# Patient Record
Sex: Male | Born: 1985 | Marital: Married
Health system: Southern US, Community
[De-identification: ages and names within clinical notes are randomized; demographics above are authoritative.]

---

## 2014-01-14 ENCOUNTER — Ambulatory Visit
Admission: RE | Admit: 2014-01-14 | Discharge: 2014-01-14 | Disposition: A | Discharge status: Home / Self Care | Payer: No Typology Code available for payment source | Source: Ambulatory Visit | Attending: *Deleted | Admitting: *Deleted

## 2014-01-14 ENCOUNTER — Other Ambulatory Visit: Payer: Self-pay | Admitting: *Deleted

## 2014-01-14 DIAGNOSIS — Z9289 Personal history of other medical treatment: Secondary | ICD-10-CM

## 2015-07-20 IMAGING — CR DG CHEST 2V
2 series · 2 of 2 positions shown · non-contrast
Comparison: None.

CLINICAL DATA: Positive PPD, smoker

EXAM:
CHEST  2 VIEW

[w chest pa]
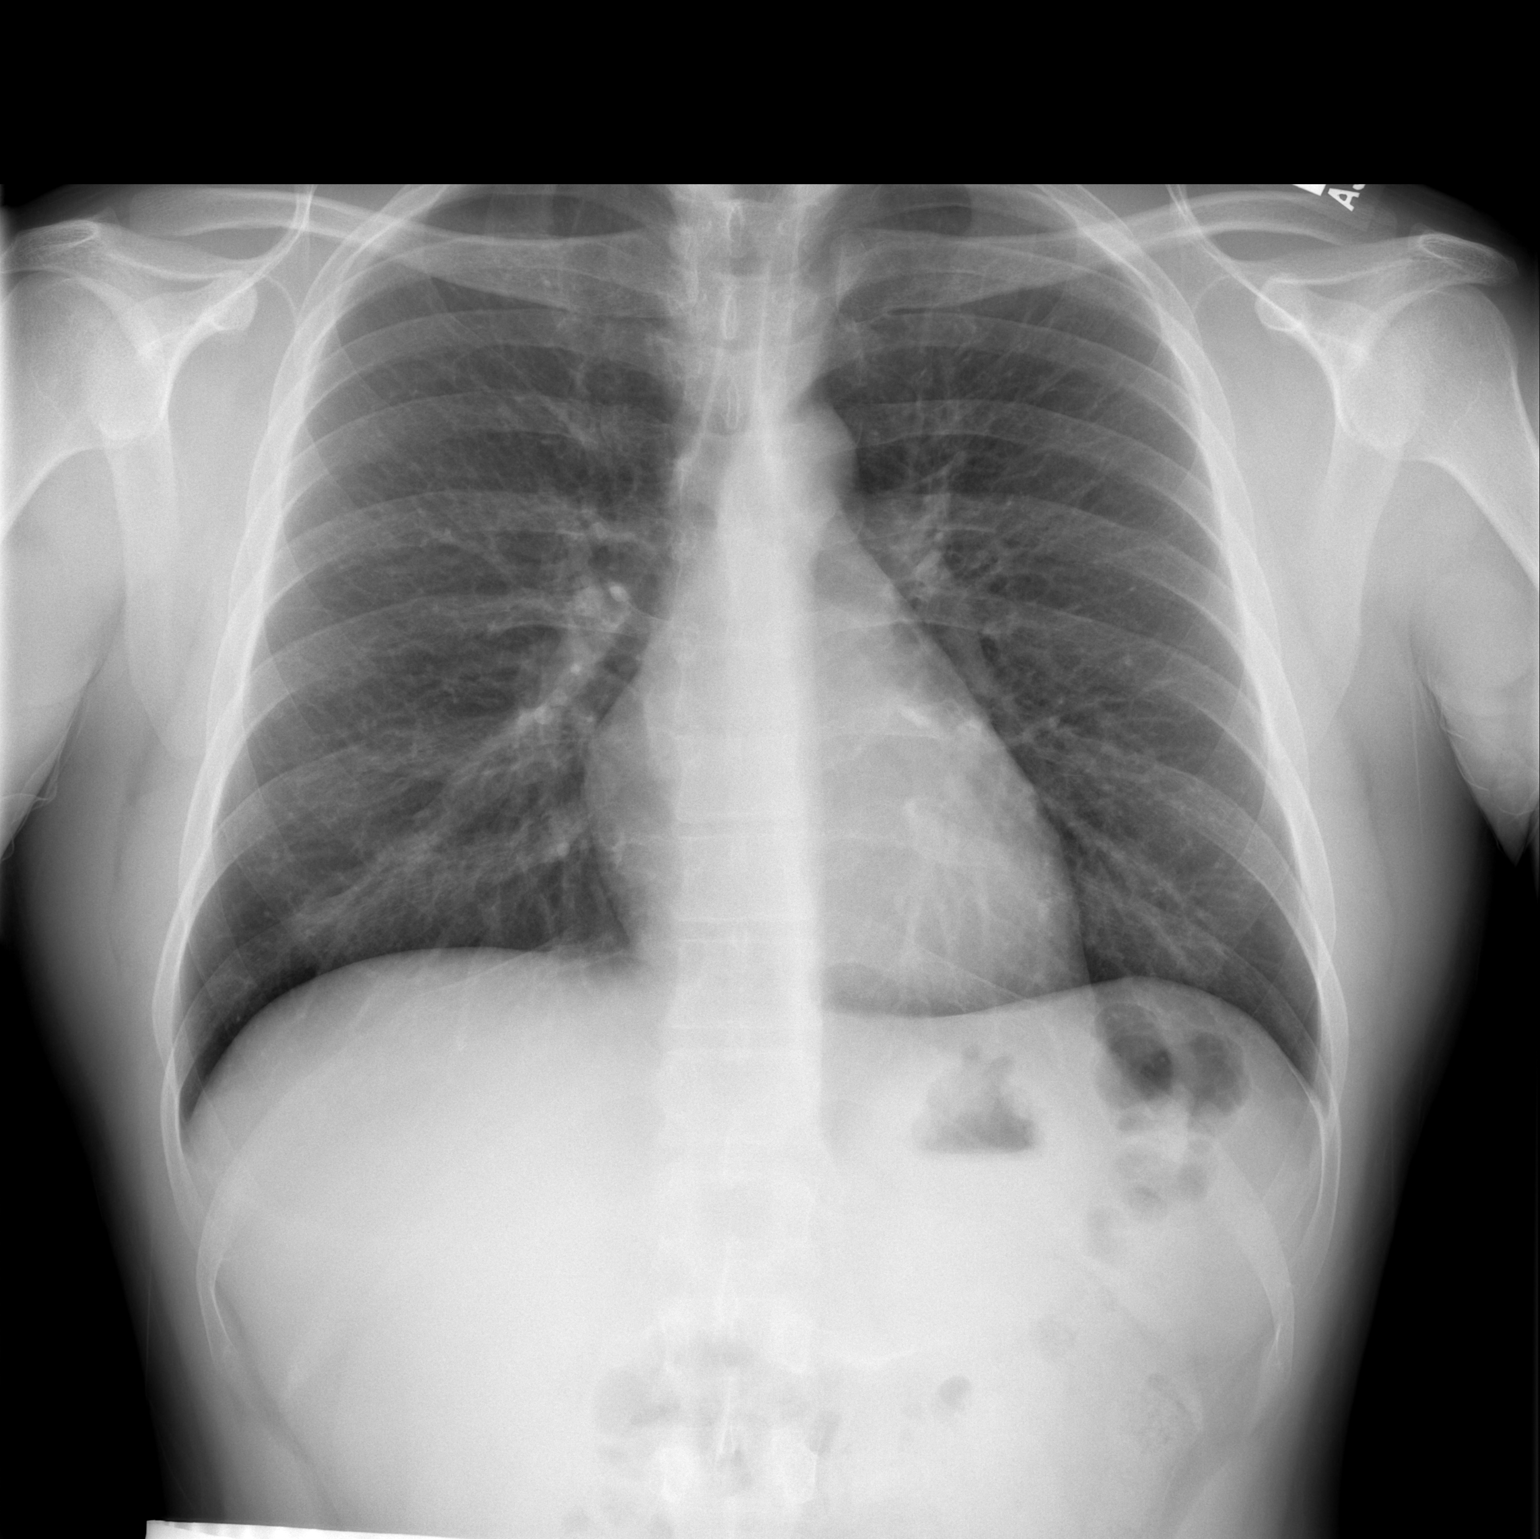

[w chest lat]
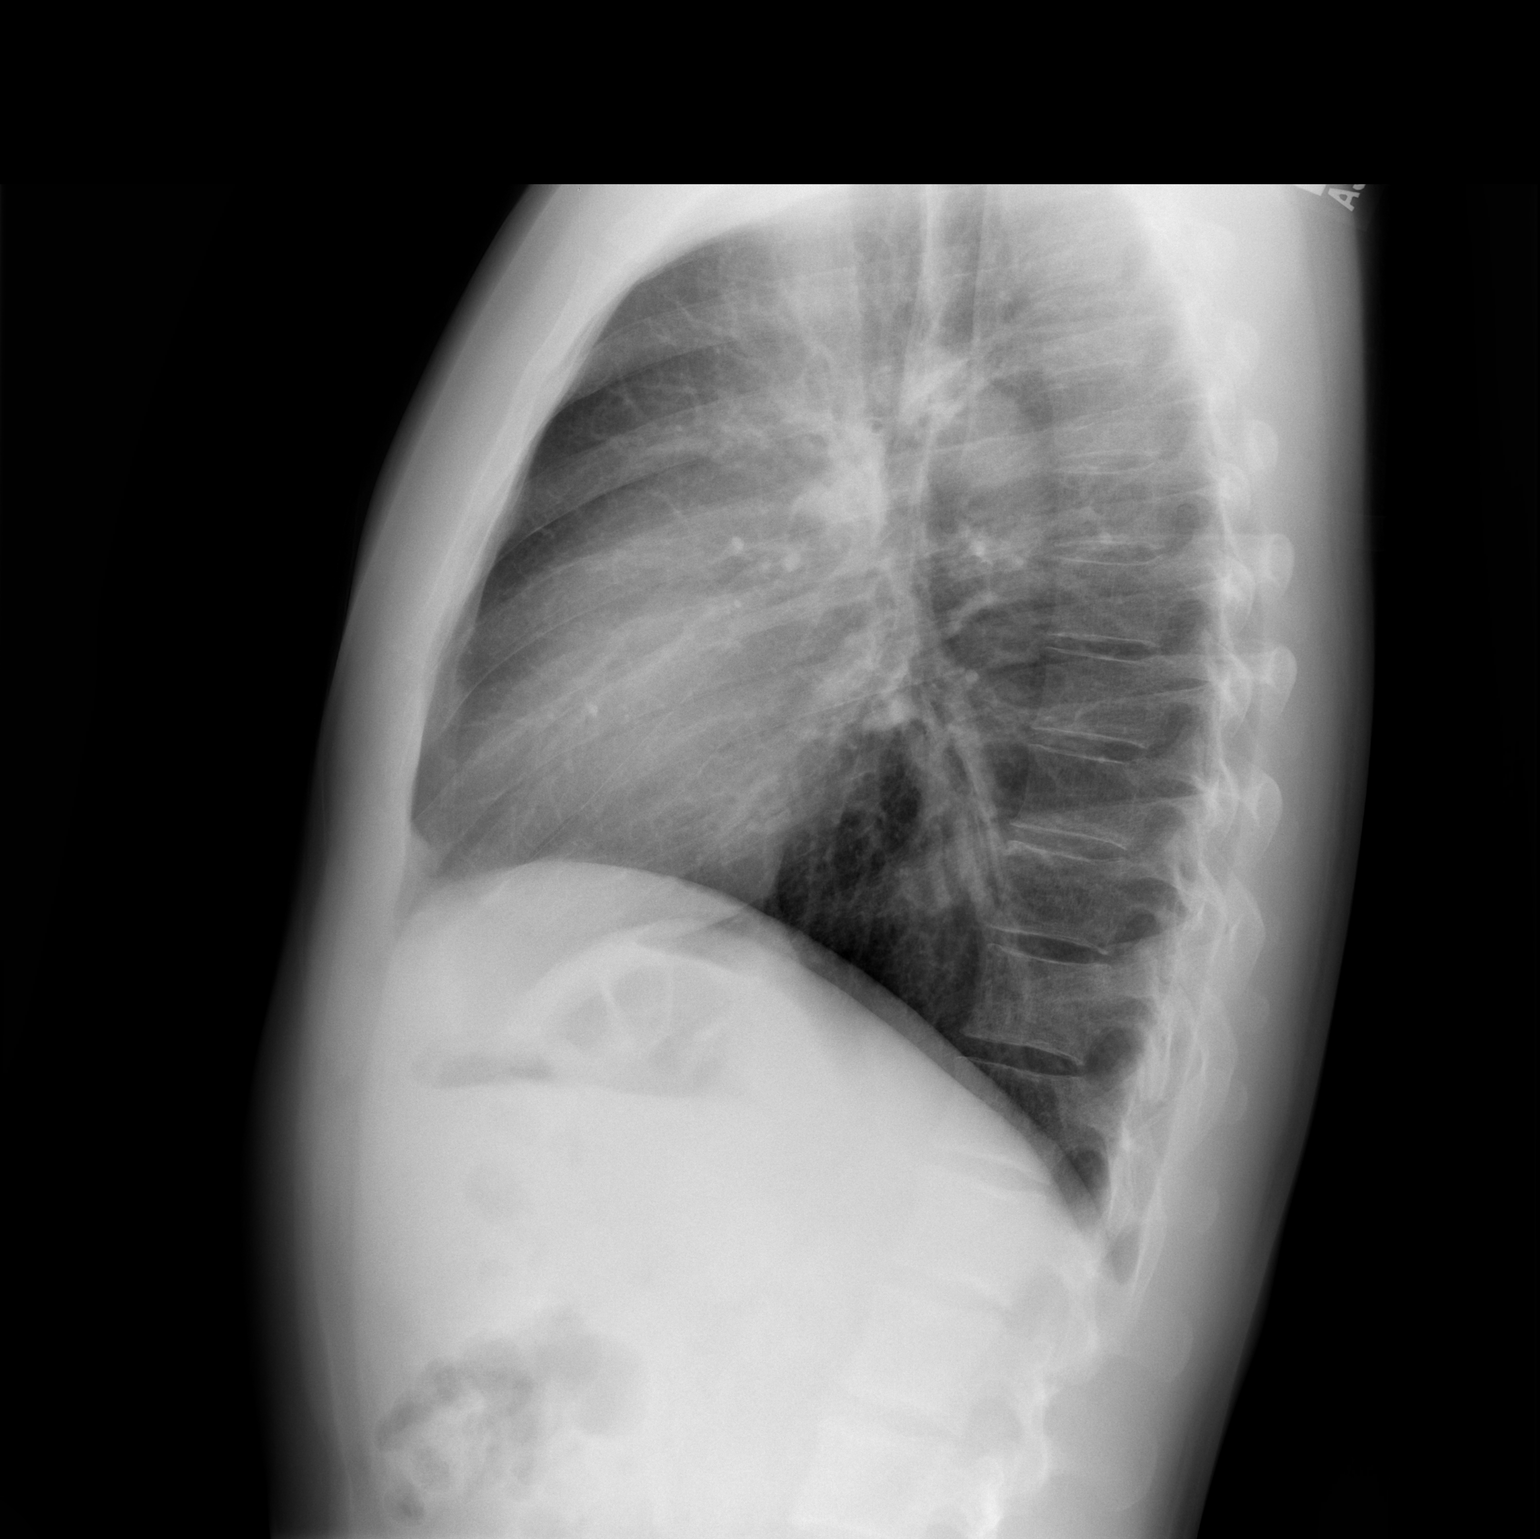

[2 of 2 positions shown; findings below may reference images not displayed]

FINDINGS: Lungs are clear. No pleural effusion or pneumothorax.

The heart is normal in size.

Visualized osseous structures are within normal limits.
IMPRESSION: No evidence of acute cardiopulmonary disease.
